# Patient Record
Sex: Male | Born: 1980 | Race: White | Hispanic: No | Marital: Single | Smoking: Current every day smoker
Health system: Southern US, Community
[De-identification: ages and names within clinical notes are randomized; demographics above are authoritative.]

## PROBLEM LIST (undated history)

## (undated) DIAGNOSIS — F319 Bipolar disorder, unspecified: Secondary | ICD-10-CM

## (undated) DIAGNOSIS — F419 Anxiety disorder, unspecified: Secondary | ICD-10-CM

## (undated) HISTORY — PX: NO PAST SURGERIES: SHX2092

---

## 2005-12-09 ENCOUNTER — Emergency Department: Payer: Self-pay | Admitting: Internal Medicine

## 2006-06-24 ENCOUNTER — Emergency Department: Payer: Self-pay | Admitting: Emergency Medicine

## 2006-08-31 ENCOUNTER — Emergency Department: Payer: Self-pay | Admitting: Emergency Medicine

## 2006-11-02 ENCOUNTER — Emergency Department: Payer: Self-pay | Admitting: General Practice
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
## 2014-03-04 LAB — COMPREHENSIVE METABOLIC PANEL
ALBUMIN: 4 g/dL (ref 3.4–5.0)
ALK PHOS: 71 U/L
AST: 23 U/L (ref 15–37)
Anion Gap: 5 — ABNORMAL LOW (ref 7–16)
BUN: 15 mg/dL (ref 7–18)
Bilirubin,Total: 0.4 mg/dL (ref 0.2–1.0)
CALCIUM: 8.8 mg/dL (ref 8.5–10.1)
CREATININE: 1.09 mg/dL (ref 0.60–1.30)
Chloride: 108 mmol/L — ABNORMAL HIGH (ref 98–107)
Co2: 26 mmol/L (ref 21–32)
EGFR (African American): >60
GLUCOSE: 103 mg/dL — AB (ref 65–99)
Osmolality: 279 (ref 275–301)
Potassium: 3.8 mmol/L (ref 3.5–5.1)
SGPT (ALT): 23 U/L (ref 12–78)
Sodium: 139 mmol/L (ref 136–145)
Total Protein: 8.4 g/dL — ABNORMAL HIGH (ref 6.4–8.2)

## 2014-03-04 LAB — URINALYSIS, COMPLETE
BACTERIA: NONE SEEN
BILIRUBIN, UR: NEGATIVE
BLOOD: NEGATIVE
Glucose,UR: NEGATIVE mg/dL (ref 0–75)
Leukocyte Esterase: NEGATIVE
NITRITE: NEGATIVE
PROTEIN: NEGATIVE
Ph: 5 (ref 4.5–8.0)
Specific Gravity: 1.027 (ref 1.003–1.030)
Squamous Epithelial: NONE SEEN
WBC UR: 1 /HPF (ref 0–5)

## 2014-03-04 LAB — CBC
HCT: 44.7 % (ref 40.0–52.0)
HGB: 15.3 g/dL (ref 13.0–18.0)
MCH: 31.8 pg (ref 26.0–34.0)
MCHC: 34.2 g/dL (ref 32.0–36.0)
MCV: 93 fL (ref 80–100)
Platelet: 250 10*3/uL (ref 150–440)
RBC: 4.8 10*6/uL (ref 4.40–5.90)
RDW: 13.3 % (ref 11.5–14.5)
WBC: 10.2 10*3/uL (ref 3.8–10.6)

## 2014-03-04 LAB — ACETAMINOPHEN LEVEL: Acetaminophen: <2 ug/mL

## 2014-03-04 LAB — DRUG SCREEN, URINE
Amphetamines, Ur Screen: NEGATIVE (ref ?–1000)
BARBITURATES, UR SCREEN: NEGATIVE (ref ?–200)
BENZODIAZEPINE, UR SCRN: NEGATIVE (ref ?–200)
CANNABINOID 50 NG, UR ~~LOC~~: POSITIVE (ref ?–50)
Cocaine Metabolite,Ur ~~LOC~~: NEGATIVE (ref ?–300)
MDMA (Ecstasy)Ur Screen: NEGATIVE (ref ?–500)
Methadone, Ur Screen: NEGATIVE (ref ?–300)
OPIATE, UR SCREEN: NEGATIVE (ref ?–300)
Phencyclidine (PCP) Ur S: NEGATIVE (ref ?–25)
Tricyclic, Ur Screen: NEGATIVE (ref ?–1000)

## 2014-03-04 LAB — ETHANOL
Ethanol %: <0.003 % (ref 0.000–0.080)
Ethanol: <3 mg/dL

## 2014-03-04 LAB — SALICYLATE LEVEL: Salicylates, Serum: 4.7 mg/dL — ABNORMAL HIGH

## 2014-03-05 ENCOUNTER — Inpatient Hospital Stay: Payer: Self-pay | Admitting: Psychiatry

## 2014-12-12 NOTE — Discharge Summary (Signed)
PATIENT NAME:  Larry BrilliantKAUTHER, Larry Peterson MR#:  [id number] DATE OF BIRTH:  [birth year]  DATE OF ADMISSION:  03/05/2014 DATE OF DISCHARGE:  03/07/2014  HOSPITAL COURSE: See dictated history and physical for details of admission. This 34 year old gentleman with a history of PTSD and depression, as well as alcohol dependence in remission was admitted to the hospital because of a return of symptoms of anger and depression with irritability, lashing out, and passive suicidal thoughts. He was cooperative with treatment and was started on Prozac, which has now been increased to 40 mg a day. He has tolerated medications well without side effects. He has participated appropriately in group and individual therapy and shown good insight. He is not reporting any current suicidal ideation or homicidal ideation. He has positive plans for the future and is looking forward to getting back to work. The patient has been advised that with his insurance he will need a provider other than [facility] and I have suggested that he try [facility] in [place] or call Dr. Lucianne MussLima or Dr. Garnetta BuddyFaheem here at the hospital or possibly try Dr. Maryruth BunKapur. The patient is agreeable to this. He will be discharged today with a plan that he returns to his [facility].   DISCHARGE MEDICATIONS: Fluoxetine 40 mg p.o. daily and trazodone 100 mg p.o. at bedtime p.r.n. for sleep.   LABORATORY RESULTS: No new labs were required after admission. Admission labs included a drug screen positive for cannabis. Chemistry panel with no real significant abnormalities. Alcohol level negative. CBC was normal and urinalysis was normal.   MENTAL STATUS EXAM AT DISCHARGE: Casually dressed, neatly groomed, young man who looks his stated age, cooperative with the treatment. Good eye contact. Normal psychomotor activity. Speech normal rate, tone and volume. Affect is calm, pleasant, reactive. Mood is stated as being better. Thoughts are lucid without loosening of associations or  delusions. Denies auditory or visual hallucinations. Denies suicidal or homicidal ideation. Shows good insight and judgment. Normal intelligence. Alert and oriented x 4. Short and long-term memory intact.   DISPOSITION: Discharge home. Follow up with private provider.   DIAGNOSIS, PRINCIPAL AND PRIMARY:  AXIS I: Posttraumatic stress disorder.   SECONDARY DIAGNOSES:  AXIS I:  1.  Depression, not otherwise specified.  2.  Alcohol dependence, in sustained remission.  AXIS II: Deferred.  AXIS III: No diagnosis.  AXIS IV: Moderate to severe from living in an [address] [facility], some isolation.  AXIS V: Functioning at time of dictation 60.   ____________________________ Audery AmelJohn T. Adena Sima, MD jtc:ts D: 03/07/2014 08:26:37 ET T: 03/07/2014 14:14:06 ET JOB#: [id number]  cc: Audery AmelJohn T. Breshae Belcher, MD, <Dictator> Audery AmelJOHN T Dalani Mette MD ELECTRONICALLY SIGNED 04/01/2014 0:31

## 2014-12-12 NOTE — H&P (Signed)
PATIENT NAME:  Larry Peterson, Larry Peterson MR#:  [id number] DATE OF BIRTH:  July 26, 1981  DATE OF ADMISSION:  03/04/2014  IDENTIFYING INFORMATION AND REASON FOR CONSULT: A 34 year old man who came to the Emergency Room stating that he was depressed and having suicidal thoughts.   CHIEF COMPLAINT: "I've been getting worse."   HISTORY OF PRESENT ILLNESS: Information obtained from the patient and the chart. The patient reports that he has been depressed for several months and it has been getting progressively worse this month. His mood feels down and sad most of the time. He finds himself getting angry and losing his temper at people over tiny things. He is sleeping poorly at night with chaotic sleep patterns. He started having some suicidal thoughts without any specific intent. He has begun having cravings for alcohol, which he thought that he had gotten over. He has been off his psychiatric medicine for many months.   PAST PSYCHIATRIC HISTORY: The patient has had previous suicide attempts and previous psychiatric admissions notably at [facility]. He has been diagnosed with PTSD and recurrent depression. He used to attend [facility] and had been on Prozac in the past, but has now been off for about 6 months.   SUBSTANCE ABUSE HISTORY: The patient has a history of alcohol dependence, currently living in an [place] house. Has maintained sobriety now for about 4 years, although he continues to occasionally use marijuana.   MEDICAL HISTORY: No significant medical problems other than nighttime urinary incontinence.   FAMILY HISTORY: Positive for substance abuse and mental health problems.   SOCIAL HISTORY: Currently living in an [place] house. He has been working regularly as a Production designer, theatre/television/film at [employer]. Has one child in the area.  Not dating or in a relationship.   CURRENT MEDICATIONS: None.   ALLERGIES: No known drug allergies.   REVIEW OF SYSTEMS:  Depressed mood, anger, irritability positive. Passive suicidal ideation.  No hallucinations.  The rest of the review of systems negative.   MENTAL STATUS EXAMINATION: Slightly disheveled gentleman, looks his stated age, cooperative with the interview. Eye contact good. Psychomotor activity normal. Speech normal rate, tone, and volume. Affect euthymic, reactive, appropriate. Mood stated as being depressed. Thoughts are lucid without obvious loosening of associations or delusions. Denies auditory or visual hallucinations. Positive suicidal ideation without intent. No homicidal ideation. Alert and oriented x 4. Short and long-term memory intact. Judgment and insight intact.   PHYSICAL EXAMINATION:  GENERAL: The patient appears to be in no acute distress.  HEENT: Pupils equal and reactive. Face symmetric. No skin lesions. Oral mucosa normal. Strength and reflexes normal and symmetric throughout. Cranial nerves symmetric and normal. Sensation normal throughout. Gait within normal limits. Full range of motion at all extremities. NECK AND BACK: Nontender.  LUNGS: Clear without wheezes.  HEART: Regular rate and rhythm.  ABDOMEN: Soft, nontender, normal bowel sounds.  VITAL SIGNS:  Blood pressure 124/84, respirations 18, pulse 72, temperature 97.6.   LABORATORY RESULTS: Drug screen was positive for cannabis. Alcohol level negative. Chemistry just shows slight abnormalities of no likely significance. CBC was all normal. Urinalysis unremarkable.   ASSESSMENT: A 34 year old man with a history of depression and PTSD, has been off medication for several months. Has chronically stressful life situation. He is requesting restart of medication. Having some suicidal thoughts and feeling a little out of control.   TREATMENT PLAN: Admit to psychiatry for stabilization and safety. Restart Prozac 20 mg a day as he had been taking previously as well as p.r.n. medicine for  sleep. Engage him in groups and activities on the unit.  Daily individual and group psychotherapy. Work on making sure he has  an appropriate discharge plan.   DIAGNOSIS, PRINCIPAL AND PRIMARY:  AXIS I: Major depression, recurrent, severe.   SECONDARY DIAGNOSES:  AXIS I:  1. Posttraumatic stress disorder.  2. Alcohol dependence in sustained remission.  AXIS II: Deferred.  AXIS III: No diagnosis.  AXIS IV: Moderate from chronic stress of sobriety and living in a halfway house.  AXIS V: Functioning at time of evaluation, 40.    ____________________________ Larry AmelJohn T. Clapacs, MD jtc:dd D: 03/04/2014 21:06:56 ET T: 03/04/2014 21:42:50 ET JOB#: [id number]  cc: Larry AmelJohn T. Clapacs, MD, <Dictator> Larry AmelJOHN T CLAPACS MD ELECTRONICALLY SIGNED 04/01/2014 0:31

## 2019-01-06 ENCOUNTER — Telehealth: Payer: Self-pay | Admitting: *Deleted

## 2019-01-06 ENCOUNTER — Other Ambulatory Visit: Payer: Self-pay

## 2019-01-06 DIAGNOSIS — Z20822 Contact with and (suspected) exposure to covid-19: Secondary | ICD-10-CM

## 2019-01-06 NOTE — Telephone Encounter (Signed)
Health dept is calling to schedule testing for patient.  [phone]- current phone number for patient

## 2019-01-09 ENCOUNTER — Ambulatory Visit: Payer: Self-pay | Admitting: *Deleted

## 2019-01-09 LAB — NOVEL CORONAVIRUS, NAA: SARS-CoV-2, NAA: NOT DETECTED

## 2019-01-09 NOTE — Telephone Encounter (Signed)
See results note for Covid-19.

## 2020-03-17 ENCOUNTER — Encounter: Payer: Self-pay | Admitting: Emergency Medicine

## 2020-03-17 ENCOUNTER — Ambulatory Visit
Admission: EM | Admit: 2020-03-17 | Discharge: 2020-03-17 | Disposition: A | Discharge status: Home / Self Care | Payer: Self-pay | Attending: Emergency Medicine | Admitting: Emergency Medicine

## 2020-03-17 ENCOUNTER — Other Ambulatory Visit: Payer: Self-pay

## 2020-03-17 DIAGNOSIS — H6592 Unspecified nonsuppurative otitis media, left ear: Secondary | ICD-10-CM

## 2020-03-17 HISTORY — DX: Bipolar disorder, unspecified: F31.9

## 2020-03-17 HISTORY — DX: Anxiety disorder, unspecified: F41.9

## 2020-03-17 MED ORDER — AMOXICILLIN 875 MG PO TABS: 875.0000 mg | ORAL_TABLET | Freq: Two times a day (BID) | ORAL | 0 refills | Status: AC

## 2020-03-17 NOTE — ED Triage Notes (Signed)
Pt c/o neck tenderness on the left side, left jaw and left ear pain. Started 5-6 days ago. Denies fever.

## 2020-03-17 NOTE — ED Provider Notes (Addendum)
[facility] ____________________________________________  Time seen: Approximately 4:47 PM  I have reviewed the triage vital signs and the nursing notes.   HISTORY  Chief Complaint Otalgia and Neck Pain   HPI Larry Peterson is a 39 y.o. male presenting for evaluation of left ear and left neck discomfort present for the last 5 days.  Patient reports pain present to the inside of his ear.  States he intermittently feels some discomfort in the left side of his neck.  Denies cough, congestion, sore throat, fevers, chest pain, shortness of breath, posterior neck pain, fever.  Continues eat and drink well.  Denies injury.  Denies drainage from ear.  Denies dental pain or gum swelling.  Denies any difficulty swallowing or shortness of breath.  Denies aggravating alleviating factors.  Does have intermittent seasonal allergies, in which he occasionally takes Allegra for, none recently.   Past Medical History:  Diagnosis Date  . Anxiety   . Bipolar disorder (HCC)     There are no problems to display for this patient.   Past Surgical History:  Procedure Laterality Date  . NO PAST SURGERIES       No current facility-administered medications for this encounter.  Current Outpatient Medications:  .  cloNIDine (CATAPRES) 0.1 MG tablet, Take 0.1 mg by mouth 2 (two) times daily., Disp: , Rfl:  .  lamoTRIgine (LAMICTAL) 100 MG tablet, Take by mouth., Disp: , Rfl:  .  amoxicillin (AMOXIL) 875 MG tablet, Take 1 tablet (875 mg total) by mouth 2 (two) times daily., Disp: 20 tablet, Rfl: 0  Allergies Patient has no known allergies.  Family History  Problem Relation Age of Onset  . Healthy Mother   . Multiple sclerosis Father     Social History Social History   Tobacco Use  . Smoking status: Current Every Day Smoker    Packs/day: 2.00  . Smokeless tobacco: Never Used  Vaping Use  . Vaping Use: Never used  Substance Use Topics  . Alcohol use: Not Currently  . Drug use:  Not Currently    Review of Systems Constitutional: No fever ENT: No sore throat.  As above. Cardiovascular: Denies chest pain. Respiratory: Denies shortness of breath. Musculoskeletal: Negative for back pain. Skin: Negative for rash.  ____________________________________________   PHYSICAL EXAM:  VITAL SIGNS: ED Triage Vitals  Enc Vitals Group     BP 03/17/20 1624 (!) 120/94     Pulse Rate 03/17/20 1624 69     Resp 03/17/20 1624 18     Temp 03/17/20 1624 99 F (37.2 C)     Temp Source 03/17/20 1624 Oral     SpO2 03/17/20 1624 98 %     Weight 03/17/20 1620 175 lb (79.4 kg)     Height 03/17/20 1620 5\' 8"  (1.727 m)     Head Circumference --      Peak Flow --      Pain Score 03/17/20 1620 4     Pain Loc --      Pain Edu? --      Excl. in GC? --    Constitutional: Alert and oriented. Well appearing and in no acute distress. Eyes: Conjunctivae are normal.  Head: Atraumatic. No sinus tenderness to palpation. No swelling. No erythema.  Ears: Right: Nontender, normal canal, no erythema, mild effusion, otherwise normal TM.  Left: Nontender, normal canal, mild effusion present, moderate erythema.  No mastoid tenderness bilaterally.  Nose: No nasal congestion.  Mouth/Throat: Mucous membranes are moist.no pharyngeal erythema. No  tonsillar swelling or exudate.  No oral edema noted.  No gumline edema noted. Neck: No stridor.  No cervical spine tenderness to palpation.  Good cervical movement.  No meningismus. Hematological/Lymphatic/Immunilogical: Left anterior cervical lymphadenopathy.  No other cervical lymphadenopathy noted. Cardiovascular: Normal rate, regular rhythm. Grossly normal heart sounds.  Good peripheral circulation. Respiratory: Normal respiratory effort.  No retractions. No wheezes, rales or rhonchi. Good air movement.  Musculoskeletal: Ambulatory with steady gait.  Neurologic:  Normal speech and language. No gait instability. Skin:  Skin appears warm, dry and intact.  No rash noted. Psychiatric: Mood and affect are normal. Speech and behavior are normal.  ___________________________________________   LABS (all labs ordered are listed, but only abnormal results are displayed)  Labs Reviewed - No data to display   PROCEDURES Procedures     INITIAL IMPRESSION / ASSESSMENT AND PLAN / ED COURSE  Pertinent labs & imaging results that were available during my care of the patient were reviewed by me and considered in my medical decision making (see chart for details).  Well-appearing patient.  No acute distress.  Left otitis with effusion.  Will treat with oral amoxicillin.  Encourage daily Allegra use as he has this at home.  Monitor.  Supportive care.Discussed indication, risks and benefits of medications with patient.   Discussed follow up with Primary care physician this week. Discussed follow up and return parameters including no resolution or any worsening concerns. Patient verbalized understanding and agreed to plan.   ____________________________________________   FINAL CLINICAL IMPRESSION(S) / ED DIAGNOSES  Final diagnoses:  Left otitis media with effusion     ED Discharge Orders         Ordered    amoxicillin (AMOXIL) 875 MG tablet  2 times daily     Discontinue  Reprint     03/17/20 1646           Note: This dictation was prepared with Dragon dictation along with smaller phrase technology. Any transcriptional errors that result from this process are unintentional.         Renford Dills, NP 03/17/20 1735

## 2020-03-17 NOTE — Discharge Instructions (Addendum)
Take medication as prescribed.  Take daily Allegra.  Rest. Drink plenty of fluids. Monitor as discussed.   Follow up with your primary care physician this week as needed. Return to Urgent care for new or worsening concerns.

## 2020-05-27 ENCOUNTER — Other Ambulatory Visit: Payer: Self-pay

## 2020-05-27 ENCOUNTER — Ambulatory Visit: Payer: Self-pay | Admitting: Physician Assistant

## 2020-05-27 DIAGNOSIS — Z113 Encounter for screening for infections with a predominantly sexual mode of transmission: Secondary | ICD-10-CM

## 2020-05-27 DIAGNOSIS — Z299 Encounter for prophylactic measures, unspecified: Secondary | ICD-10-CM

## 2020-05-27 LAB — GRAM STAIN

## 2020-05-27 MED ORDER — ACYCLOVIR 800 MG PO TABS: 800.0000 mg | ORAL_TABLET | Freq: Two times a day (BID) | ORAL | 12 refills | Status: AC

## 2020-05-27 MED ORDER — ACYCLOVIR 400 MG PO TABS: 400.0000 mg | ORAL_TABLET | Freq: Three times a day (TID) | ORAL | 0 refills | Status: AC

## 2020-05-28 ENCOUNTER — Encounter: Payer: Self-pay | Admitting: Physician Assistant

## 2020-05-28 NOTE — Progress Notes (Signed)
[facility] Department STI clinic/screening visit  Subjective:  Larry Peterson is a 39 y.o. male being seen today for an STI screening visit. The patient reports they do have symptoms.    Patient has the following medical conditions:  There are no problems to display for this patient.    Chief Complaint  Patient presents with  . SEXUALLY TRANSMITTED DISEASE    screening    HPI  Patient reports that he has had "bumps" and dysuria for 2 days.  Reports that recent partner recently tested positive for HSV.  Reports history of anxiety and bipolar disorder for which he takes medicines regularly.  Denies other chronic conditions and surgeries.  States last HIV, Hep B and Hep C testing was 4 years ago.  Last void prior to sample collection for Gram stain was over 2 hr ago.     See flowsheet for further details and programmatic requirements.    The following portions of the patient's history were reviewed and updated as appropriate: allergies, current medications, past medical history, past social history, past surgical history and problem list.  Objective:  There were no vitals filed for this visit.  Physical Exam Constitutional:      General: He is not in acute distress.    Appearance: Normal appearance.  HENT:     Head: Normocephalic and atraumatic.     Comments: No nits,lice, or hair loss. No cervical, supraclavicular or axillary adenopathy.    Mouth/Throat:     Mouth: Mucous membranes are moist.     Pharynx: Oropharynx is clear. No oropharyngeal exudate or posterior oropharyngeal erythema.  Eyes:     Conjunctiva/sclera: Conjunctivae normal.  Pulmonary:     Effort: Pulmonary effort is normal.  Abdominal:     Palpations: Abdomen is soft. There is no mass.     Tenderness: There is no abdominal tenderness. There is no guarding or rebound.  Genitourinary:    Penis: Normal.      Testes: Normal.     Comments: Pubic area without nits, lice, hair loss, edema,  erythema, and inguinal adenopathy. At base of penis, 1 cluster of 2 ~58mm slightly ulcerative partially healed lesions, tender to culture. Penis circumcised without rash, lesions and discharge at meatus. Musculoskeletal:     Cervical back: Neck supple. No tenderness.  Skin:    General: Skin is warm and dry.     Findings: No bruising, erythema, lesion or rash.  Neurological:     Mental Status: He is alert and oriented to person, place, and time.  Psychiatric:        Mood and Affect: Mood normal.        Behavior: Behavior normal.        Thought Content: Thought content normal.        Judgment: Judgment normal.       Assessment and Plan:  Cartez Mogle is a 39 y.o. male presenting to the [facility] Department for STI screening  1. Screening for STD (sexually transmitted disease) Patient into clinic with symptoms. Reviewed with patient Gram stain results and no treatment indicated for that today. Rec condoms with all sex. Await test results.  Counseled that RN will call if needs to RTC for treatment once results are back. - Gram stain - Gonococcus culture - HIV/HCV [facility] - Virology, [facility] - Syphilis Serology, [facility]  2. 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 No sex until after lesions have healed. Per patient request Rx for Acyclovir 800 mg # 10 1 po BID for 5 days prn for outbreak with refills for 1 year. - acyclovir (ZOVIRAX) 400 MG tablet; Take 1 tablet (400 mg total) by mouth 3 (three) times daily for 10 days.  Dispense: 30 tablet; Refill: 0 - acyclovir (ZOVIRAX) 800 MG tablet; Take 1 tablet (800 mg total) by mouth 2 (two) times daily for 5 days.  Dispense: 10 tablet; Refill: 12     No follow-ups on file.  No future appointments.  Matt Holmes, PA

## 2020-05-30 NOTE — Progress Notes (Signed)
Chart reviewed by Pharmacist  Suzanne Walker PharmD, Contract Pharmacist at [facility]  

## 2020-06-01 LAB — GONOCOCCUS CULTURE

## 2020-06-02 ENCOUNTER — Encounter: Payer: Self-pay | Admitting: Physician Assistant

## 2020-06-02 DIAGNOSIS — B009 Herpesviral infection, unspecified: Secondary | ICD-10-CM

## 2020-06-02 HISTORY — DX: Herpesviral infection, unspecified: B00.9

## 2020-06-03 ENCOUNTER — Encounter: Payer: Self-pay | Admitting: Family Medicine

## 2020-06-03 LAB — HM HEPATITIS C SCREENING LAB: HM Hepatitis Screen: NEGATIVE

## 2020-06-03 LAB — HM HIV SCREENING LAB: HM HIV Screening: NEGATIVE
